# Patient Record
Sex: Male | Born: 1995 | Race: White | Hispanic: No | Marital: Single
Health system: Southern US, Community
[De-identification: ages and names within clinical notes are randomized; demographics above are authoritative.]

## PROBLEM LIST (undated history)

## (undated) DIAGNOSIS — H538 Other visual disturbances: Secondary | ICD-10-CM

## (undated) DIAGNOSIS — J45909 Unspecified asthma, uncomplicated: Secondary | ICD-10-CM

## (undated) DIAGNOSIS — R519 Headache, unspecified: Secondary | ICD-10-CM

## (undated) DIAGNOSIS — R51 Headache: Secondary | ICD-10-CM

## (undated) DIAGNOSIS — R000 Tachycardia, unspecified: Secondary | ICD-10-CM

## (undated) DIAGNOSIS — F5111 Primary hypersomnia: Secondary | ICD-10-CM

## (undated) DIAGNOSIS — G4730 Sleep apnea, unspecified: Secondary | ICD-10-CM

## (undated) HISTORY — DX: Unspecified asthma, uncomplicated: J45.909

## (undated) HISTORY — DX: Primary hypersomnia: F51.11

## (undated) HISTORY — DX: Headache, unspecified: R51.9

## (undated) HISTORY — DX: Tachycardia, unspecified: R00.0

## (undated) HISTORY — DX: Sleep apnea, unspecified: G47.30

## (undated) HISTORY — DX: Other visual disturbances: H53.8

## (undated) HISTORY — DX: Headache: R51

---

## 2013-12-28 ENCOUNTER — Ambulatory Visit (INDEPENDENT_AMBULATORY_CARE_PROVIDER_SITE_OTHER): Payer: BC Managed Care – PPO | Admitting: Family

## 2013-12-28 ENCOUNTER — Telehealth: Payer: Self-pay | Admitting: Family

## 2013-12-28 ENCOUNTER — Encounter (HOSPITAL_BASED_OUTPATIENT_CLINIC_OR_DEPARTMENT_OTHER): Payer: Self-pay

## 2013-12-28 ENCOUNTER — Encounter: Payer: Self-pay | Admitting: Family

## 2013-12-28 ENCOUNTER — Ambulatory Visit (HOSPITAL_BASED_OUTPATIENT_CLINIC_OR_DEPARTMENT_OTHER)
Admission: RE | Admit: 2013-12-28 | Discharge: 2013-12-28 | Disposition: A | Discharge status: Home / Self Care | Payer: BC Managed Care – PPO | Source: Ambulatory Visit | Attending: Family | Admitting: Family

## 2013-12-28 VITALS — BP 122/68 | HR 78 | Temp 98.2°F | Ht 74.25 in | Wt 211.1 lb

## 2013-12-28 DIAGNOSIS — M79609 Pain in unspecified limb: Secondary | ICD-10-CM | POA: Insufficient documentation

## 2013-12-28 DIAGNOSIS — M79669 Pain in unspecified lower leg: Secondary | ICD-10-CM

## 2013-12-28 DIAGNOSIS — R55 Syncope and collapse: Secondary | ICD-10-CM

## 2013-12-28 MED ORDER — IOHEXOL 350 MG/ML SOLN
100.0000 mL | Freq: Once | INTRAVENOUS | Status: AC | PRN
  Administered 2013-12-28: 100 mL via INTRAVENOUS

## 2013-12-28 NOTE — Telephone Encounter (Signed)
Relevant patient education assigned to patient using Emmi. ° °

## 2013-12-28 NOTE — Telephone Encounter (Signed)
Spoke to pt's mom (pt requested that I contact her with results).  Reviewed normal CTA chest and neg doppler.

## 2013-12-28 NOTE — Patient Instructions (Signed)
Please complete your CT angio and Leg ultrasound on the first floor. You will be contacted about your echocardiogram, heart monitor and cardiology referral. Please follow up in 2-3 weeks, sooner if symptoms worsen, or if symptoms do not improve. Welcome to [facility]!

## 2013-12-28 NOTE — Progress Notes (Signed)
   Subjective:    Patient ID: Jonathan Frost, male    DOB: [birth year], 18 y.o.   MRN: [id number]  HPI  Mr. Jonathan Frost is a 18 yr old male who presents today to establish care. He presents with the following history. Reports that he was in a hot tub on 12/03/13 for several hours. The following day he had an episode where he became light headed and nervous and had very brief "for a second" loss of consciousness.  [facility] but reports that he signed himself out AMA due to an extended wait. He was told by the ED staff that his EKG was normal.  He then presented to his health services at his college and had another EKG and some lab work which he reports was normal.  He reports that he continues to have light headedness which is associated with some shortness of breath and generalized weakness. These episodes are intermittent, but worsened by strenuous activity. As a result he has been avoiding strenuous activity. He has not had any further LOC. He does report that he has occasional associated palpitations with these episodes. Did have some cramping pain in the right calf several days ago.  Recent travel includes plane ride to the [place] and a 4.5 h Denies hx of anxiety. wakness.    Review of Systems See HPI  Past Medical History  Diagnosis Date  . Asthma     History   Social History  . Marital Status: Single    Spouse Name: N/A    Number of Children: N/A  . Years of Education: N/A   Occupational History  . Not on file.   Social History Main Topics  . Smoking status: Current Some Day Smoker  . Smokeless tobacco: Never Used  . Alcohol Use: Not on file     Comment: three or four times a week  . Drug Use: Not on file  . Sexual Activity: Not on file   Other Topics Concern  . Not on file   Social History Narrative  . No narrative on file    History reviewed. No pertinent past surgical history.  No family history on file.  Allergies not on file  No current  outpatient prescriptions on file prior to visit.   No current facility-administered medications on file prior to visit.    BP 122/68  Pulse 78  Temp(Src) 98.2 F (36.8 C) (Oral)  Ht 6' 2.25" (1.886 m)  Wt 211 lb 1.3 oz (95.745 kg)  BMI 26.92 kg/m2  SpO2 99%       Objective:   Physical Exam  Constitutional: He is oriented to person, place, and time. He appears well-developed and well-nourished. No distress.  HENT:  Head: Normocephalic and atraumatic.  Eyes: No scleral icterus.  Cardiovascular: Normal rate and regular rhythm.   No murmur heard. Pulmonary/Chest: Effort normal and breath sounds normal. No respiratory distress. He has no wheezes. He has no rales. He exhibits no tenderness.  Abdominal: Soft. He exhibits no distension. There is no tenderness.  Musculoskeletal: He exhibits no edema.  Lymphadenopathy:    He has no cervical adenopathy.  Neurological: He is alert and oriented to person, place, and time.  Psychiatric: He has a normal mood and affect. His behavior is normal. Judgment and thought content normal.          Assessment & Plan:

## 2013-12-28 NOTE — Progress Notes (Signed)
Pre visit review using our clinic review tool, if applicable. No additional management support is needed unless otherwise documented below in the visit note. 

## 2013-12-28 NOTE — Assessment & Plan Note (Signed)
CTA chest neg for PE and LE doppler negative for clot.  Differential includes dehydration, vertigo, anemia, thyroid disorder, cardiac etiology such as arrythmia. Spoke to pt's mom via phone at pt's request. She would like pt referred to cardiology. Referral placed. Will obtain 2D echo and holter monitor. Pt is instructed not to drive until further notice and verbalizes understanding.

## 2013-12-29 ENCOUNTER — Encounter (INDEPENDENT_AMBULATORY_CARE_PROVIDER_SITE_OTHER): Payer: BC Managed Care – PPO

## 2013-12-29 ENCOUNTER — Encounter: Payer: Self-pay | Admitting: *Deleted

## 2013-12-29 ENCOUNTER — Encounter: Payer: Self-pay | Admitting: Cardiovascular Disease

## 2013-12-29 ENCOUNTER — Ambulatory Visit (INDEPENDENT_AMBULATORY_CARE_PROVIDER_SITE_OTHER): Payer: BC Managed Care – PPO | Admitting: Cardiovascular Disease

## 2013-12-29 VITALS — BP 118/68 | HR 60 | Ht 74.0 in | Wt 208.0 lb

## 2013-12-29 DIAGNOSIS — R55 Syncope and collapse: Secondary | ICD-10-CM

## 2013-12-29 NOTE — Assessment & Plan Note (Signed)
Doubt cardiac etiology ECG no abnormalities normal intervals no family history .  CT with normal coronary origins.  Will arrange f/u stress echo to make sure heart structurally normal and normal hemodynamic response to exercise.  F/U primary and consider neuro w/u as more [place] than cardiac

## 2013-12-29 NOTE — Patient Instructions (Signed)
Your physician recommends that you schedule a follow-up appointment in:   AS NEEDED   Your physician recommends that you continue on your current medications as directed. Please refer to the Current Medication list given to you today.   Your physician has requested that you have a stress echocardiogram. For further information please visit [email or web address]. Please follow instruction sheet as given.  

## 2013-12-29 NOTE — Progress Notes (Signed)
Patient ID: Jonathan Frost, male   DOB: 12-25-1995, 18 y.o.   MRN: [id number]  18 yo referred for dizzyness  Has been healthy.  With no pre-existing heart issues    Reports that he was in a hot tub on 12/03/13 for several hours. The following day he had an episode where he became light headed and nervous and had very brief "for a second" loss of consciousness. [facility] but reports that he signed himself out AMA due to an extended wait. He was told by the ED staff that his EKG was normal. He then presented to his health services at his college and had another EKG and some lab work which he reports was normal. He reports that he continues to have light headedness which is associated with some shortness of breath and generalized weakness.  One episode was after lifting weights in gym These episodes are intermittent, but worsened by strenuous activity. As a result he has been avoiding strenuous activity. He has not had any further LOC. He does report that he has occasional associated palpitations with these episodes. Did have some cramping pain in the right calf several days ago. Recent travel includes plane ride to the [place] and a 4.5 h  Denies hx of anxiety.   CT was done by primary and negative as was d dimer   On review of CT coronary origins are normal with no anomaly   Occasional drinking on weekend Occasional smoking again mostly on week end   ROS: Denies fever, malais, weight loss, blurry vision, decreased visual acuity, cough, sputum, SOB, hemoptysis, pleuritic pain, palpitaitons, heartburn, abdominal pain, melena, lower extremity edema, claudication, or rash.  All other systems reviewed and negative   General: Affect appropriate Healthy:  appears stated age HEENT: normal Neck supple with no adenopathy JVP normal no bruits no thyromegaly Lungs clear with no wheezing and good diaphragmatic motion Heart:  S1/S2 no murmur,rub, gallop or click PMI normal Abdomen:  benighn, BS positve, no tenderness, no AAA no bruit.  No HSM or HJR Distal pulses intact with no bruits No edema Neuro non-focal Skin warm and dry No muscular weakness  Medications Current Outpatient Prescriptions  Medication Sig Dispense Refill  . fluticasone (FLONASE) 50 MCG/ACT nasal spray Place into both nostrils daily.      Marland Kitchen. loratadine (CLARITIN) 10 MG tablet Take 10 mg by mouth daily.       No current facility-administered medications for this visit.    Allergies Biaxin  Family History: Family History  Problem Relation Age of Onset  . Cancer Mother   . Mitral valve prolapse Father     Social History: History   Social History  . Marital Status: Single    Spouse Name: N/A    Number of Children: N/A  . Years of Education: N/A   Occupational History  . Not on file.   Social History Main Topics  . Smoking status: Current Some Day Smoker  . Smokeless tobacco: Never Used  . Alcohol Use: Not on file     Comment: three or four times a week  . Drug Use: Not on file  . Sexual Activity: Not on file   Other Topics Concern  . 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  All other systems reviewed and negative   General: Affect appropriate Healthy:  appears stated age HEENT: normal Neck supple with no adenopathy JVP normal no bruits no thyromegaly Lungs clear with no wheezing and good diaphragmatic motion Heart:  S1/S2 no murmur,rub, gallop or click PMI normal Abdomen: benighn, BS positve, no tenderness, no AAA no bruit.  No HSM or HJR Distal pulses intact with no bruits No edema Neuro non-focal Skin  warm and dry No muscular weakness  Medications No current outpatient prescriptions on file.   No current facility-administered medications for this visit.    Allergies Review of patient's allergies indicates not on file.  Family History: Family History  Problem Relation Age of Onset  . Cancer Mother   . Mitral valve prolapse Father     Social History: History   Social History  . Marital Status: Single    Spouse Name: N/A    Number of Children: N/A  . Years of Education: N/A   Occupational History  . Not on file.   Social History Main Topics  . Smoking status: Current Some Day Smoker  . Smokeless tobacco: Never Used  . Alcohol Use: Not on file     Comment: three or four times a week  . Drug Use: Not on file  . Sexual Activity: Not on file   Other Topics Concern  . 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

## 2013-12-29 NOTE — Progress Notes (Signed)
Patient ID: Jonathan Frost, Jonathan Frost   DOB: [birth year], 18 y.o.   MRN: [id number] E-Cardio 24 hour holter monitor applied to patient.

## 2014-01-09 ENCOUNTER — Other Ambulatory Visit (HOSPITAL_COMMUNITY): Payer: Self-pay | Admitting: Family

## 2014-01-09 DIAGNOSIS — R55 Syncope and collapse: Secondary | ICD-10-CM

## 2014-01-09 DIAGNOSIS — R0602 Shortness of breath: Secondary | ICD-10-CM

## 2014-01-11 ENCOUNTER — Ambulatory Visit: Payer: BC Managed Care – PPO | Admitting: Family

## 2014-01-11 DIAGNOSIS — Z0289 Encounter for other administrative examinations: Secondary | ICD-10-CM

## 2014-01-12 ENCOUNTER — Ambulatory Visit (HOSPITAL_COMMUNITY): Payer: BC Managed Care – PPO | Attending: Cardiology | Admitting: Radiology

## 2014-01-12 DIAGNOSIS — R0602 Shortness of breath: Secondary | ICD-10-CM | POA: Insufficient documentation

## 2014-01-12 DIAGNOSIS — R55 Syncope and collapse: Secondary | ICD-10-CM | POA: Insufficient documentation

## 2014-01-12 NOTE — Progress Notes (Signed)
Echocardiogram performed.  

## 2014-01-16 ENCOUNTER — Telehealth: Payer: Self-pay | Admitting: *Deleted

## 2014-01-16 NOTE — Telephone Encounter (Signed)
Notified pt and scheduled f/u with us on 01/27/14 at 11:30 as pt prefers Friday am appts and already has stress echo on 01/20/14.

## 2014-01-16 NOTE — Telephone Encounter (Signed)
Message copied by Kathi SimpersFERGERSON, Dianne Bady A on Mon Jan 16, 2014  6:51 PM ------      Message from: O'SULLIVAN, MELISSA      Created: Thu Jan 12, 2014  9:21 PM       Please let pt know 2decho negative. I would like him to schedule follow up apt in office to discuss further work up and see how he is doing. ------

## 2014-01-20 ENCOUNTER — Encounter: Payer: Self-pay | Admitting: Cardiology

## 2014-01-20 ENCOUNTER — Ambulatory Visit (HOSPITAL_COMMUNITY): Payer: BC Managed Care – PPO | Attending: Cardiology

## 2014-01-20 ENCOUNTER — Other Ambulatory Visit (HOSPITAL_COMMUNITY): Payer: BC Managed Care – PPO

## 2014-01-20 DIAGNOSIS — R55 Syncope and collapse: Secondary | ICD-10-CM | POA: Insufficient documentation

## 2014-01-20 NOTE — Progress Notes (Signed)
Stress echo performed. 

## 2014-01-23 ENCOUNTER — Telehealth: Payer: Self-pay | Admitting: *Deleted

## 2014-01-23 ENCOUNTER — Other Ambulatory Visit: Payer: Self-pay | Admitting: *Deleted

## 2014-01-23 NOTE — Telephone Encounter (Signed)
Left message for pt explaining our office would be calling him to schedule consult w/ EP secondary to monitor results that showed SVT, rate 201 (per Dr Eden EmmsNishan). [employer]/gallery conservator(scheduler) notified.

## 2014-01-24 ENCOUNTER — Telehealth: Payer: Self-pay | Admitting: *Deleted

## 2014-01-24 NOTE — Telephone Encounter (Signed)
Spoke with pt's dad (ok'd) and explained that recommended EP f/u per monitor results/Dr. Eden EmmsNishan. Pt is returning to [place] in a couple weeks once college is out, so the plan is for pt to see EP doc there, and appt already scheduled.   Copy of last OV, echo, stress echo, and monitor results left at front desk for pt to pick up. He is also going to drop off medical release form if needed for later time.

## 2014-01-27 ENCOUNTER — Telehealth: Payer: Self-pay | Admitting: *Deleted

## 2014-01-27 ENCOUNTER — Ambulatory Visit: Payer: BC Managed Care – PPO | Admitting: Family

## 2014-01-27 NOTE — Telephone Encounter (Signed)
LEFT  MESSAGE  RE MONITOR   RESULTS   OFFICE  SPOKE  WITH PT'S  DAD  WILL  F/U   WITH  EP  IN DIFFER STATE .Zack Seal/CY

## 2014-02-01 ENCOUNTER — Telehealth: Payer: Self-pay | Admitting: Cardiovascular Disease

## 2014-02-01 NOTE — Telephone Encounter (Signed)
New message  ° ° °Patient calling back to speak with nurse  °

## 2014-02-01 NOTE — Telephone Encounter (Signed)
PT  AWARE OF STRESS ECHO RESULTS ./CY 

## 2014-04-04 ENCOUNTER — Telehealth: Payer: Self-pay | Admitting: *Deleted

## 2014-04-04 NOTE — Telephone Encounter (Signed)
Received message from pt's mother requesting confirmation that we have sent pt's records to his PCP in [place]. No one is listed on pt's HIPPA contacts. Will need to confirm request with pt and obtain PCP information.  Left message for pt to return my call.

## 2014-04-04 NOTE — Telephone Encounter (Signed)
Spoke with pt. He is currently in [place] and requests that we send all of our office notes / tests / imaging studies to Dr Antionette Pol[facility]Nix at [facility], ph)[phone]. Advised pt that if he is needing cardiology information he will need to have his PCP fax records release and he voices understanding. Please send requested records to Dr Antionette PolesNix. Thanks!

## 2014-04-06 NOTE — Telephone Encounter (Signed)
Records faxed.

## 2015-01-11 ENCOUNTER — Ambulatory Visit (INDEPENDENT_AMBULATORY_CARE_PROVIDER_SITE_OTHER): Payer: BLUE CROSS/BLUE SHIELD | Admitting: Neurology

## 2015-01-11 ENCOUNTER — Encounter: Payer: Self-pay | Admitting: Neurology

## 2015-01-11 VITALS — BP 108/67 | HR 81 | Resp 18 | Ht 74.0 in | Wt 226.0 lb

## 2015-01-11 DIAGNOSIS — J301 Allergic rhinitis due to pollen: Secondary | ICD-10-CM | POA: Diagnosis not present

## 2015-01-11 DIAGNOSIS — Z9989 Dependence on other enabling machines and devices: Principal | ICD-10-CM

## 2015-01-11 DIAGNOSIS — G4733 Obstructive sleep apnea (adult) (pediatric): Secondary | ICD-10-CM | POA: Diagnosis not present

## 2015-01-11 DIAGNOSIS — E663 Overweight: Secondary | ICD-10-CM | POA: Diagnosis not present

## 2015-01-11 NOTE — Patient Instructions (Signed)
Please continue using your CPAP regularly. While your insurance requires that you use CPAP at least 4 hours each night on 70% of the nights, I recommend, that you not skip any nights and use it throughout the night if you can. Getting used to CPAP and staying with the treatment long term does take time and patience and discipline. Untreated obstructive sleep apnea when it is moderate to severe can have an adverse impact on cardiovascular health and raise her risk for heart disease, arrhythmias, hypertension, congestive heart failure, stroke and diabetes. Untreated obstructive sleep apnea causes sleep disruption, nonrestorative sleep, and sleep deprivation. This can have an impact on your day to day functioning and cause daytime sleepiness and impairment of cognitive function, memory loss, mood disturbance, and problems focussing. Using CPAP regularly can improve these symptoms.  Try to lose weight. We may be able to recheck with a sleep study, later this year.

## 2015-01-11 NOTE — Progress Notes (Signed)
Subjective:    Patient ID: Jonathan Frost is a 19 y.o. male.  HPI      Jonathan Foley, MD, PhD [facility] [address], [address] [address] [place], [place] [address]  Dear Alphonzo Lemmings,  I saw your patient, Jonathan Frost, upon your kind request in my neurologic clinic today for initial consultation of his obstructive sleep apnea, on CPAP therapy. The patient is unaccompanied today. 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 I reviewed your extensive office records which you kindly included. This included an office note from 05/23/2014, an office note from 05/11/2014, and office note from 04/26/2014, at which time he had vitamin B12, vitamin B6, vitamin B12 1, CK and myasthenia gravis panel tested. He had an office visit at 04/13/2014.  I reviewed his sleep study results from 04/30/2014 performed at the [facility] in [place]. Sleep efficiency was 61%. Sleep latency was 41.5 minutes. He had 16.6% of REM sleep. REM latency was 41.5 minutes. His total AHI was 18.5 per hour. Average oxygen saturation was 96%, nadir was 90%. There were no significant PLMS. I reviewed his CPAP titration study from 05/15/2014: Sleep efficiency was 82.1%, latency to sleep 48.5 minutes, REM percentage 23.7 with a REM latency of 48.5 minutes. CPAP was titrated from 4-8 cm. At 7 cm his AHI was 1.9 per hour. He had a PLM index of 7.6 with minimal arousals. Average oxygen saturation was 96%, nadir was 92%. Today, I reviewed his CPAP compliance data from 12/12/2014 through 01/10/2015 which is a total of 30 days during which time he  used his machine every night with percent used days greater than 4 hours at 93%, indicating excellent compliance with an average usage of 7 hours and 41 minutes on a pressure of 7 with EPR of 2. This particular download does not give the residual AHI or leak information unfortunately. He reports that since he started CPAP therapy actually feels better. His daytime energy is improved, his sleep quality and sleep consolidation and improved and he did not have the lack of energy or fatigue and myalgias he had before, for which he had blood work and other workup last year. He denies any restless leg symptoms. He uses a fullface mask and tolerates it. He goes to bed between 11:30 or midnight and falls asleep within half an hour typically. He does not have much in the way of middle of the night awakenings at this time. His rise time is 8:15 on Monday, Wednesday and Fridays and usually later on Tuesdays and Thursdays as his classes do not start until 1 PM on those days. He denies parasomnias. He denies excessive daytime sleepiness except when he does not use his CPAP. He usually makes sure he uses it every night. His Epworth sleepiness score is 8 out of 24 today, fatigue score is 40. He does not drink caffeine daily. He drinks alcohol 3 times a week on average, he does not smoke. He does not report a family history of obstructive sleep apnea. 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  Allergies flareup from time to time. He uses Flonase and over-the-counter allergy medication.  His Past Medical History Is Significant For: Past Medical History  Diagnosis Date  . Asthma   . Headache   . Tachycardia   . Blurred vision   . Primary hypersomnia with sleep apnea     His Past Surgical History Is Significant For: No past surgical history on file.  His Family History Is Significant For: Family History  Problem Relation Age of Onset  . Cancer  Mother   . Mitral valve prolapse Father   . Cancer Father   . Alzheimer's disease Paternal Grandmother     His Social History Is Significant For: History   Social History  . Marital Status: Single    Spouse Name: N/A  . Number of Children: N/A  . Years of Education: 4year colg   Occupational History  . Student     Social History Main Topics  . Smoking status: Passive Smoke Exposure - Never Smoker  . Smokeless tobacco: Never Used  . Alcohol Use: 0.0 oz/week    0 Standard drinks or equivalent per week     Comment: three or four times a week  . Drug Use: No  . Sexual Activity: Not on file   Other Topics Concern  . 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 Biaxin [Clarithromycin]     Hives   :   His Current Medications Are:  Outpatient Encounter Prescriptions as of 01/11/2015  Medication Sig  . EPINEPHrine 0.3 mg/0.3 mL IJ SOAJ injection INJECT 1 APPLICATOR AS INSTRUCTED FOR 1 DOSE. IN THE EVENT  OF ANT,BEE , OR WASP STING  . fluticasone (FLONASE) 50 MCG/ACT nasal spray Place into both nostrils daily.  Marland Kitchen loratadine (CLARITIN) 10 MG tablet Take 10 mg by mouth daily.  :  Review of Systems:  Out of a complete 14 point review of systems, all are reviewed and negative with the exception of these symptoms as listed below:   Review of Systems  Eyes:       Blurred vision   Respiratory:       Snoring   Musculoskeletal:       Cramps, Aching muscles   Neurological:       Memory loss, Confusion, Sleepiness  Psychiatric/Behavioral:       Decreased energy     Objective:  Neurologic Exam  Physical Exam Physical Examination:   Filed Vitals:   01/11/15 0839  BP: 108/67  Pulse: 81  Resp: 18   General Examination: The patient is a very pleasant 19 y.o. male in no acute distress. He appears well-developed and well-nourished and well groomed.    HEENT: Normocephalic, atraumatic, pupils are equal, round and reactive to light and accommodation. Funduscopic exam is normal with sharp disc margins noted. Extraocular tracking is good without limitation to gaze excursion or nystagmus noted. Normal smooth pursuit is noted. Hearing is grossly intact. Tympanic membranes are clear bilaterally. Face is symmetric with normal facial animation and normal facial sensation. Speech is clear with no dysarthria noted. There is no hypophonia. There is no lip, neck/head, jaw or voice tremor. Neck is supple with full range of passive and active motion. There are no carotid bruits on auscultation. Oropharynx exam reveals: mild mouth dryness, good dental hygiene and mild airway crowding, due to narrow airway  entry and tonsils in place. Uvula and tongue are normal in size. Tonsils are not swollen or inflamed appearing, 1+ bilaterally. Mallampati is class I. Neck size is 16.75 inches. He has a Mild overbite. Nasal inspection reveals no significant nasal mucosal bogginess or redness and no septal deviation.   Chest: Clear to auscultation without wheezing, rhonchi or crackles noted.  Heart: S1+S2+0, regular and normal without murmurs, rubs or gallops noted.   Abdomen: Soft, non-tender and non-distended with normal bowel sounds appreciated on auscultation.  Extremities: There is no pitting edema in the distal lower extremities bilaterally. Pedal pulses are intact.  Skin: Warm and dry without trophic changes noted. There are no varicose veins.  Musculoskeletal: exam reveals no obvious joint deformities, tenderness or joint swelling or erythema.   Neurologically:  Mental status: The patient is awake, alert and oriented in all 4 spheres. His immediate and remote memory, attention, language skills and fund of knowledge are appropriate. There is no evidence of aphasia, agnosia, apraxia or anomia. Speech is clear with normal prosody and enunciation. Thought process is  linear. Mood is normal and affect is normal.  Cranial nerves II - XII are as described above under HEENT exam. In addition: shoulder shrug is normal with equal shoulder height noted. Motor exam: Normal bulk, strength and tone is noted. There is no drift, tremor or rebound. Romberg is negative. Reflexes are 2+ throughout. Babinski: Toes are flexor bilaterally. Fine motor skills and coordination: intact with normal finger taps, normal hand movements, normal rapid alternating patting, normal foot taps and normal foot agility.  Cerebellar testing: No dysmetria or intention tremor on finger to nose testing. Heel to shin is unremarkable bilaterally. There is no truncal or gait ataxia.  Sensory exam: intact to light touch, pinprick, vibration, temperature sense in the upper and lower extremities.  Gait, station and balance: He stands easily. No veering to one side is noted. No leaning to one side is noted. Posture is age-appropriate and stance is narrow based. Gait shows normal stride length and normal pace. No problems turning are noted. He turns en bloc. Tandem walk is unremarkable.   Assessment and Plan:   In summary, Dameion Briles is a very pleasant 19 y.o.-year old male with an underlying medical history of overweight state, headaches, SVT, history of syncope, who was diagnosed with obstructive sleep apnea in July 2015 with a polysomnogram and he had a subsequent CPAP titration study in August 2015, after which he was placed on CPAP therapy at 7 cm. He is completely compliant with treatment. He indicates good results with CPAP therapy and improvement in his daytime energy, fatigue, myalgias and sleep consolidation as well as sleep quality. He is congratulated on his excellent compliance. Physical exam and neurological exam are nonfocal. We talked about obstructive sleep apnea and risks and ramifications of untreated sleep apnea as well. We also talked about alternative treatments including pursuing more  aggressive weight loss, dental appliance, surgical options and at this time he is encouraged to continue his CPAP. He is encouraged to pursue weight loss and we can consider reevaluation of his obstructive sleep apnea with a repeat sleep study may be later this year. We will pick up our discussion next time he comes which should be around 6 months from now. He is interested in considering a dental appliance if the need for treatment is still there. The prospect of using CPAP long-term is not very desirable for him, which I can understand at his young age. Nevertheless, he  is willing to continue with treatment for now as he has done so well with it.  I answered all his questions today and the patient was in agreement with the above outlined plan. I would like to see the patient back in 6 months, sooner if the need arises and encouraged him to call or email me through My Chart with any interim questions, concerns, problems or updates. For his seasonal allergies he is using over-the-counter medication and Flonase. He has no new concerns in that regard.

## 2015-03-12 IMAGING — US US EXTREM LOW VENOUS*R*
1 series · 14 of 24 positions shown · non-contrast
Comparison: None.

CLINICAL DATA: Right calf pain

EXAM:
Right LOWER EXTREMITY VENOUS DOPPLER ULTRASOUND
TECHNIQUE: Gray-scale sonography with graded compression, as well as color
Doppler and duplex ultrasound, were performed to evaluate the deep
venous system from the level of the common femoral vein through the
popliteal and proximal calf veins. Spectral Doppler was utilized to
evaluate flow at rest and with distal augmentation maneuvers.

[Series 1: us extrem low venous*right* · 0.09mm/px · 14 of 24 slices shown]
[im 1/24]
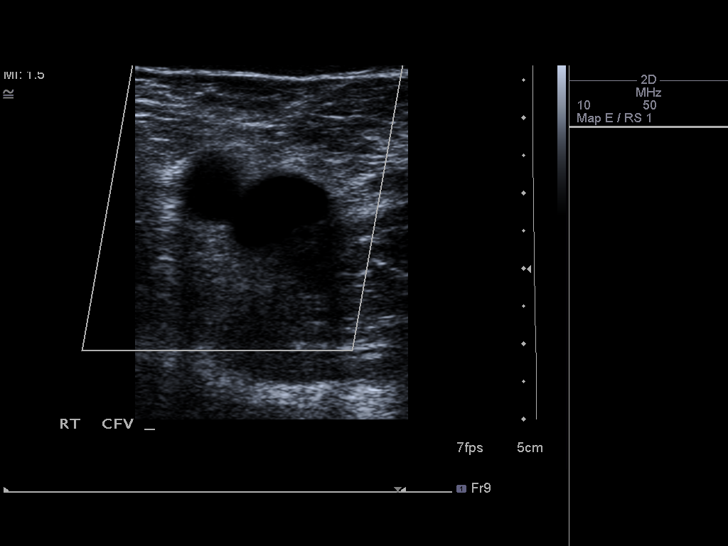
[im 3/24]
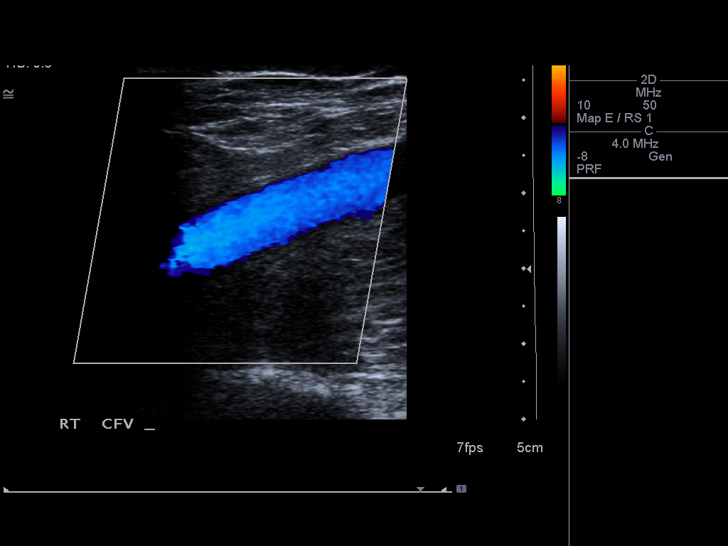
[im 5/24]
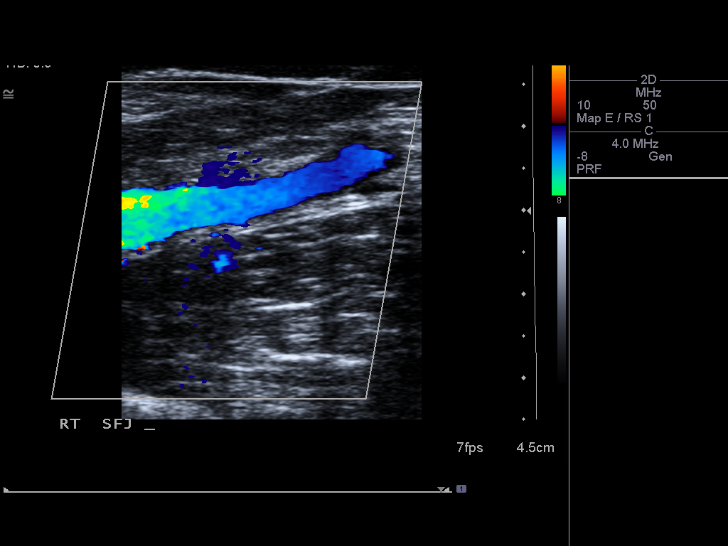
[im 7/24]
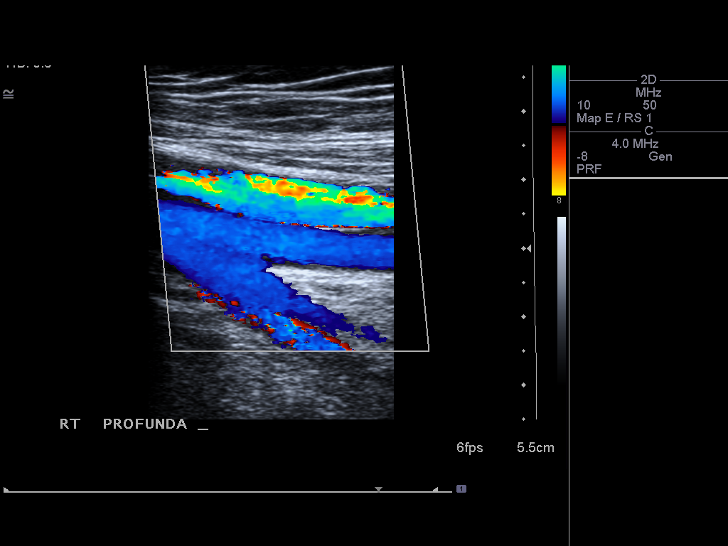
[im 8/24]
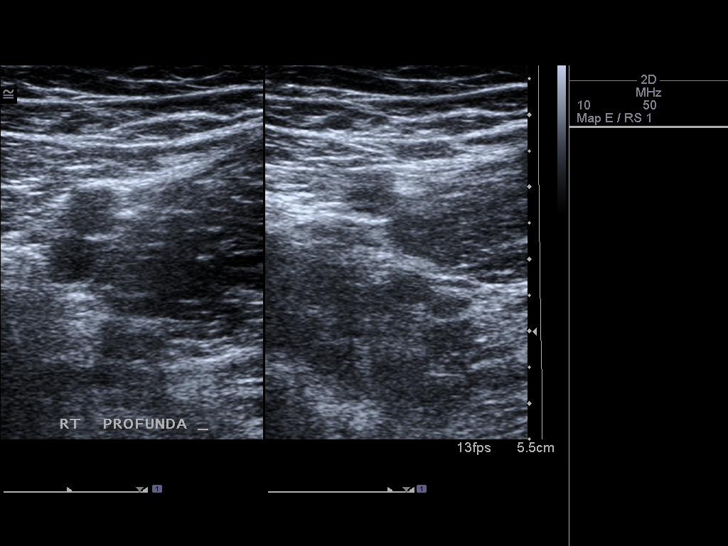
[im 10/24]
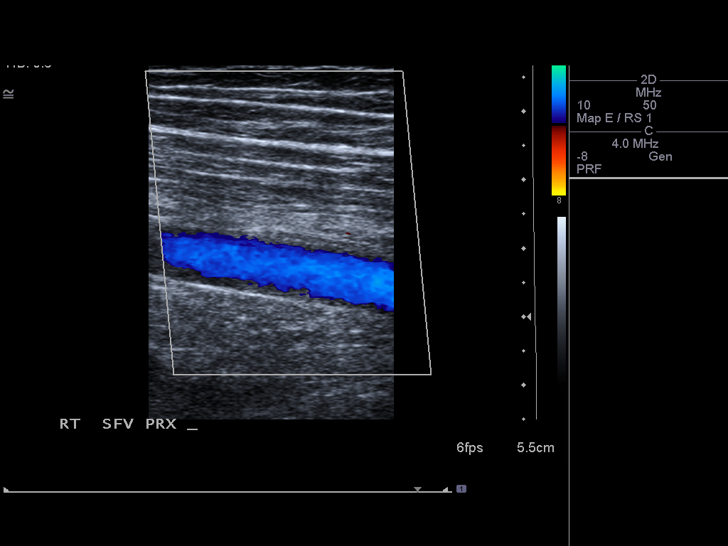
[im 12/24]
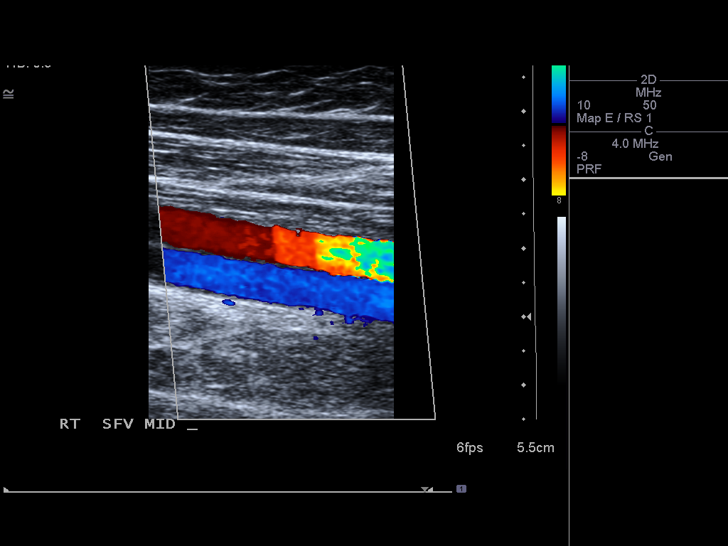
[im 13/24]
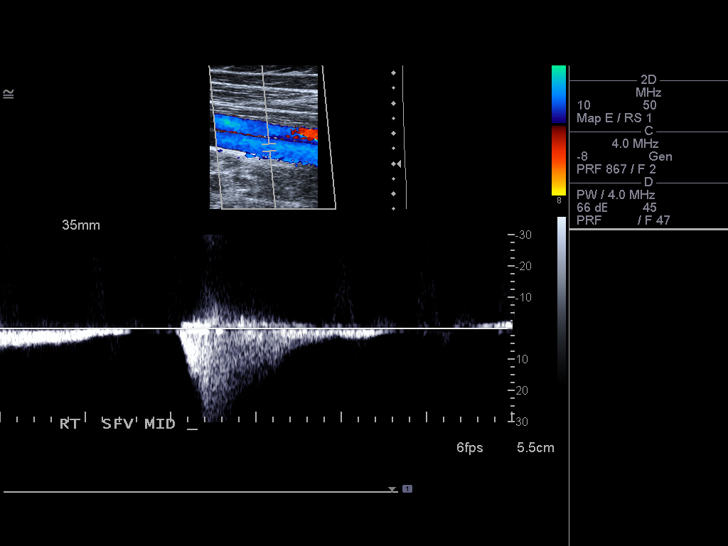
[im 15/24]
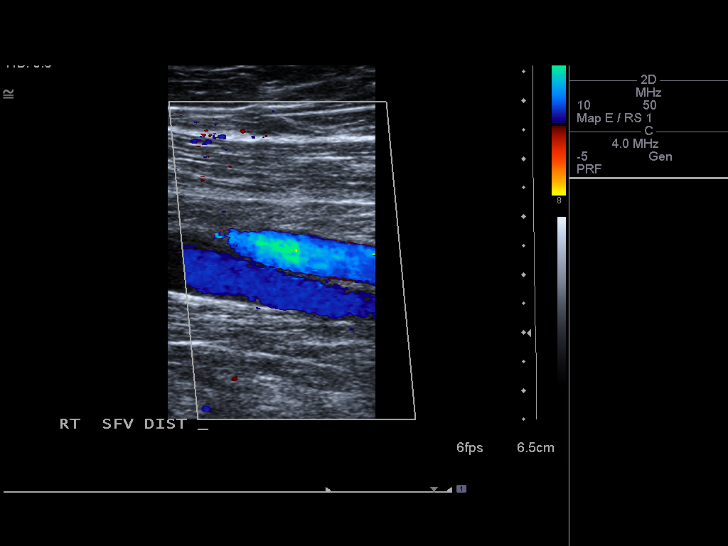
[im 17/24]
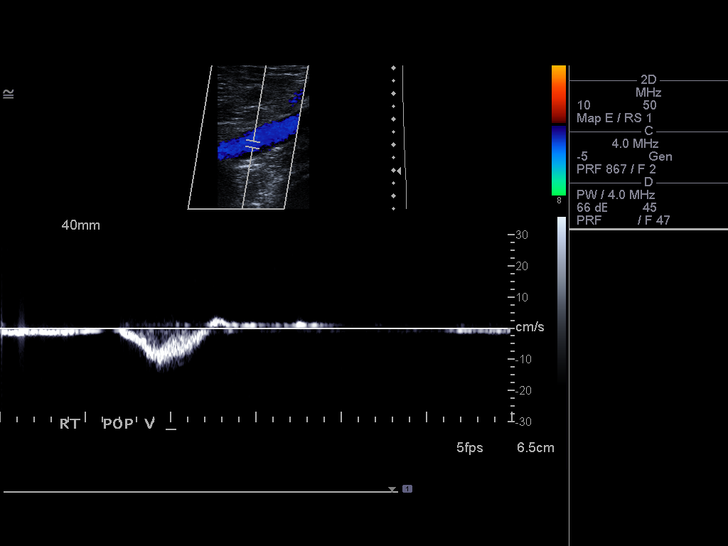
[im 19/24]
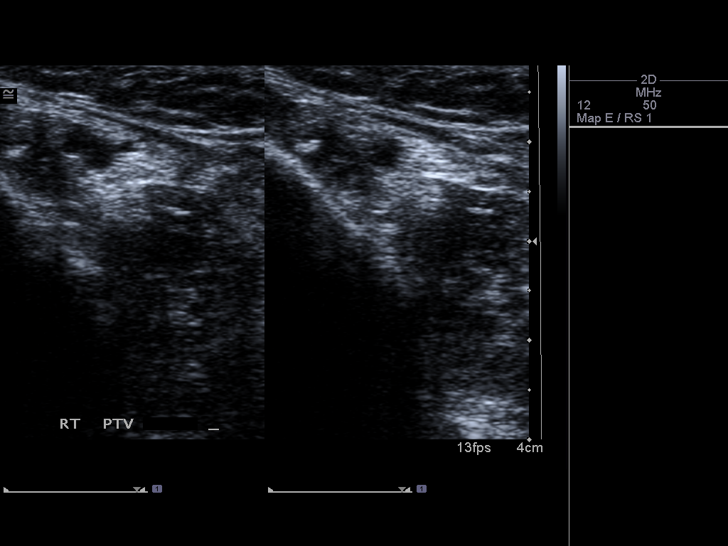
[im 20/24]
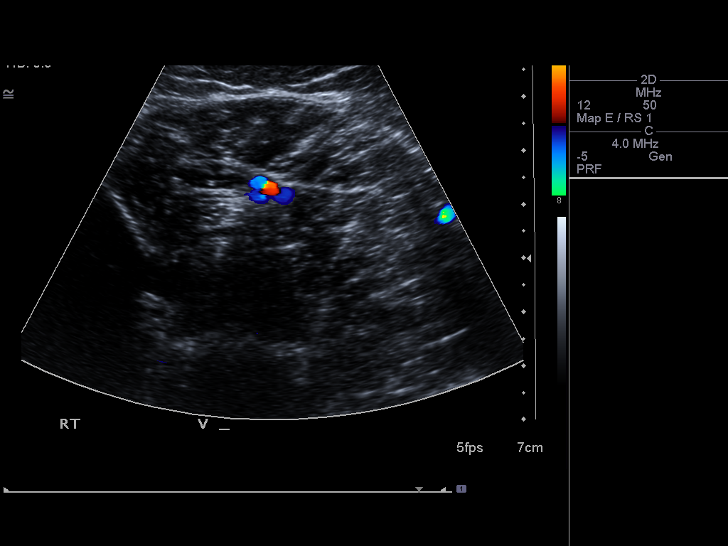
[im 22/24]
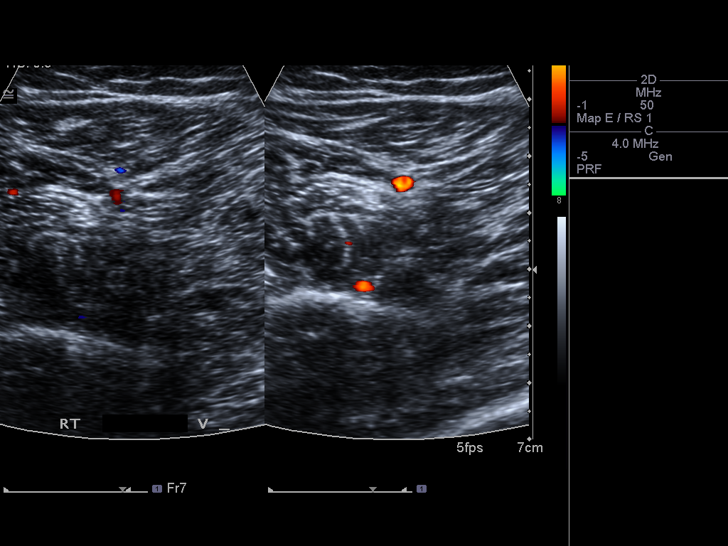
[im 24/24]
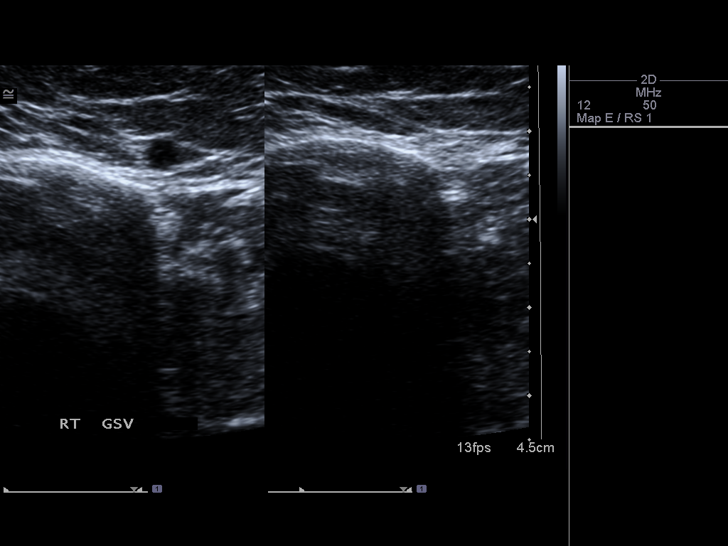

[14 of 24 positions shown; findings below may reference images not displayed]

FINDINGS: Thrombus within deep veins:  None visualized.

Compressibility of deep veins:  Normal.

Duplex waveform respiratory phasicity:  Normal.

Duplex waveform response to augmentation:  Normal.

Venous reflux:  None visualized.

Other findings:  None visualized.
IMPRESSION: No evidence of deep venous thrombosis.

## 2015-05-29 ENCOUNTER — Encounter: Payer: Self-pay | Admitting: Neurology

## 2015-07-13 ENCOUNTER — Ambulatory Visit: Payer: BLUE CROSS/BLUE SHIELD | Admitting: Neurology
